# Patient Record
Sex: Male | Born: 1955 | Race: White | Hispanic: No | Marital: Married | Smoking: Never smoker
Health system: Southern US, Community
[De-identification: ages and names within clinical notes are randomized; demographics above are authoritative.]

## PROBLEM LIST (undated history)

## (undated) DIAGNOSIS — E039 Hypothyroidism, unspecified: Secondary | ICD-10-CM

## (undated) DIAGNOSIS — Z9289 Personal history of other medical treatment: Secondary | ICD-10-CM

## (undated) DIAGNOSIS — Z0389 Encounter for observation for other suspected diseases and conditions ruled out: Secondary | ICD-10-CM

## (undated) DIAGNOSIS — I10 Essential (primary) hypertension: Secondary | ICD-10-CM

## (undated) DIAGNOSIS — I480 Paroxysmal atrial fibrillation: Secondary | ICD-10-CM

## (undated) HISTORY — DX: Personal history of other medical treatment: Z92.89

## (undated) HISTORY — DX: Essential (primary) hypertension: I10

## (undated) HISTORY — DX: Hypothyroidism, unspecified: E03.9

## (undated) HISTORY — DX: Encounter for observation for other suspected diseases and conditions ruled out: Z03.89

## (undated) HISTORY — DX: Paroxysmal atrial fibrillation: I48.0

---

## 2006-05-06 DIAGNOSIS — IMO0001 Reserved for inherently not codable concepts without codable children: Secondary | ICD-10-CM

## 2006-05-06 HISTORY — DX: Reserved for inherently not codable concepts without codable children: IMO0001

## 2006-11-19 ENCOUNTER — Encounter (INDEPENDENT_AMBULATORY_CARE_PROVIDER_SITE_OTHER): Payer: Self-pay | Admitting: Cardiology

## 2006-11-19 ENCOUNTER — Inpatient Hospital Stay (HOSPITAL_COMMUNITY): Admission: EM | Admit: 2006-11-19 | Discharge: 2006-11-20 | Payer: Self-pay | Admitting: Emergency Medicine

## 2006-11-19 DIAGNOSIS — Z9289 Personal history of other medical treatment: Secondary | ICD-10-CM

## 2006-11-19 HISTORY — DX: Personal history of other medical treatment: Z92.89

## 2006-11-19 HISTORY — PX: CARDIAC CATHETERIZATION: SHX172

## 2007-08-05 IMAGING — CR DG CHEST 1V PORT
1 series · 1 of 1 positions shown · non-contrast
Comparison: None available.

CLINICAL DATA: 51-year-old with chest pain.  
 PORTABLE CHEST - 1 VIEW 11/19/06:

[view not recorded]
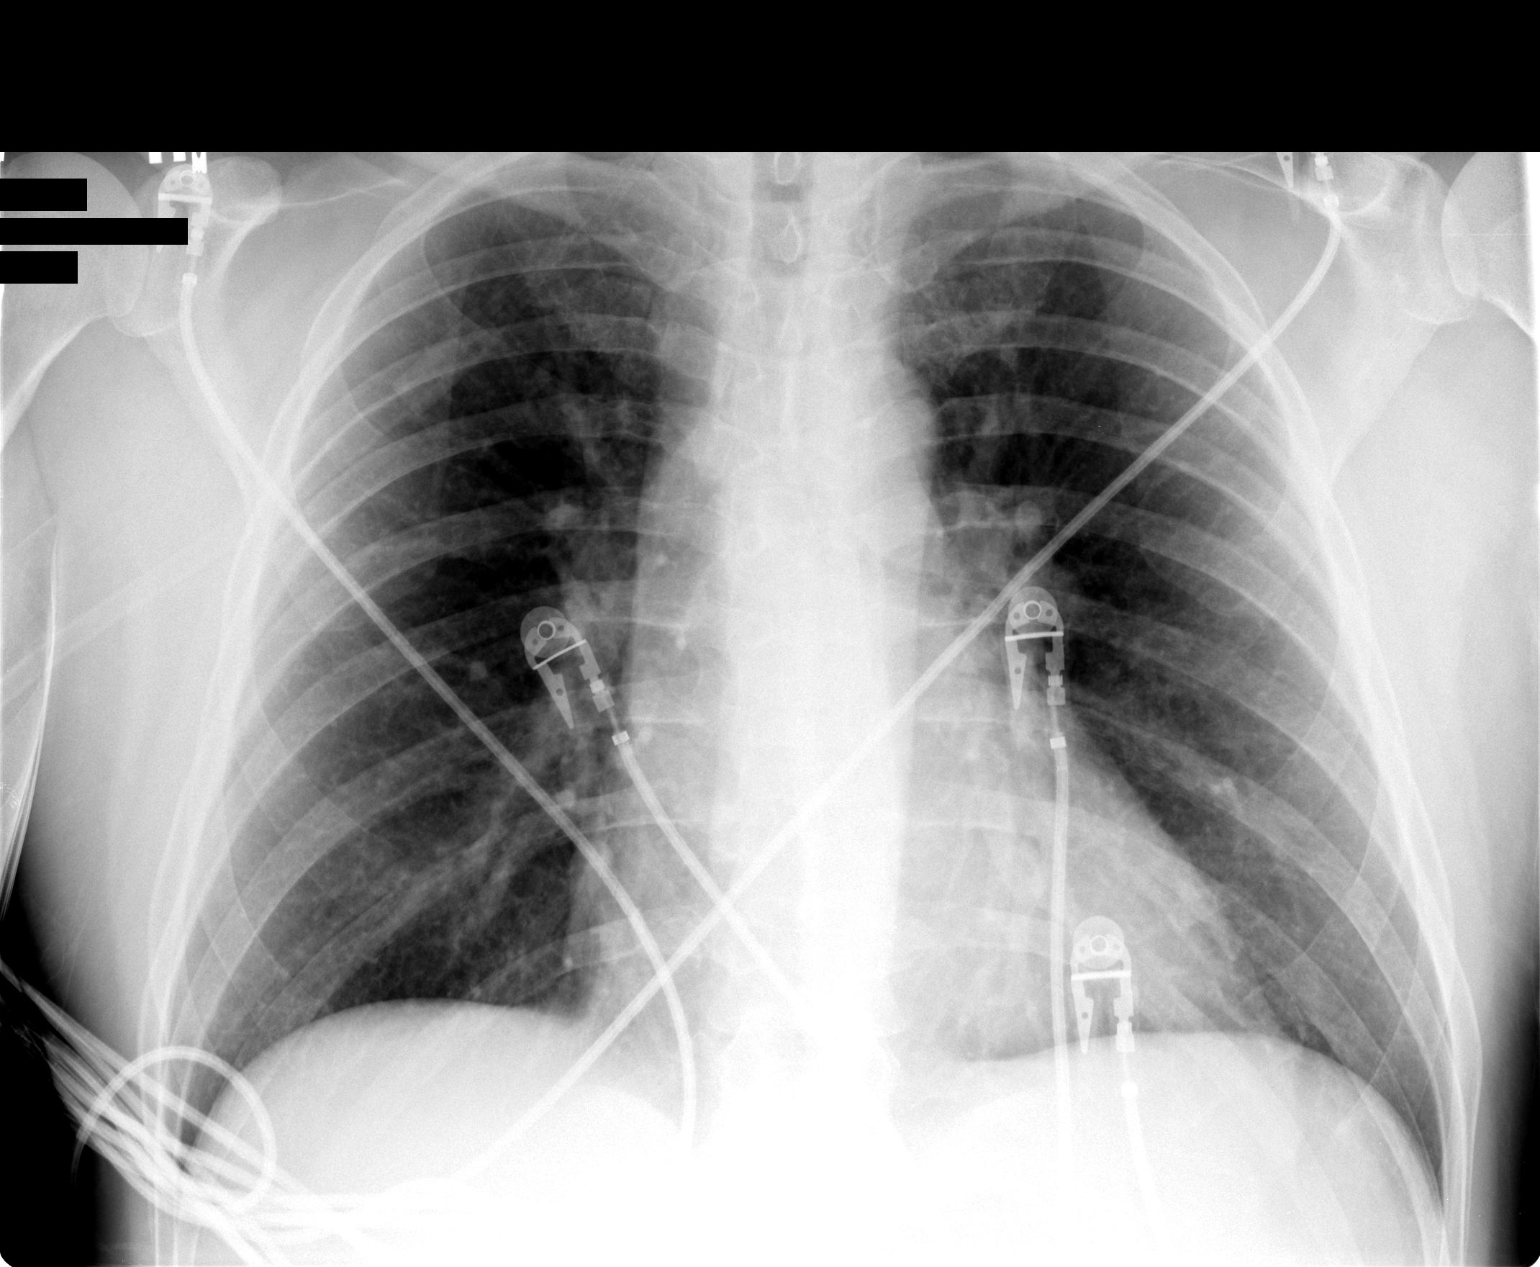

[1 of 1 positions shown; findings below may reference images not displayed]

FINDINGS: Cardiac silhouette is upper limits of normal given the AP portable projection.  Mediastinal and hilar contours are within normal limits.  Lungs are clear of acute process.  Bony structures are intact.
IMPRESSION: No acute cardiopulmonary findings.

## 2010-09-18 NOTE — H&P (Signed)
NAMEWORTHY, BOSCHERT NO.:  [id number]   MEDICAL RECORD NO.:  [id number]          PATIENT TYPE:  INP   LOCATION:  3707                         FACILITY:  [facility]   PHYSICIAN:  Ulyses Amor, MD DATE OF BIRTH:  04-27-1956   DATE OF ADMISSION:  11/19/2006  DATE OF DISCHARGE:                              HISTORY & PHYSICAL   Warren Nguyen is a 55 year old white male who is admitted to the [facility]  for the new onset of atrial fibrillation with a rapid  ventricular rate.  The patient, who has no past history of cardiac  disease, experienced the onset of palpitations and an irregular heart  beat while watching television this evening.  There was no associated  chest pain, tightness, heaviness, pressure, or squeezing.  Nor was there  any dyspnea, diaphoresis, nausea, dizziness, lightheadedness, near  syncope, or syncope.  EMS was summoned and he was found to be in atrial  fibrillation.  He was transported to the emergency department.  His  initial heart rate on presentation to the emergency department was 138  beats per minute. The patient remains in atrial fibrillation at this  time though his heart rate has been diminished with intravenous  diltiazem.   As noted, the patient has no past history of cardiac disease, including  no history of chest pain, myocardial infarction, coronary artery  disease, congestive heart failure, or arrhythmia.  He has no risk  factors for coronary artery disease, including no history of  hypertension, dyslipidemia, diabetes mellitus, smoking, or family  history of coronary artery disease.   The patient's past medical history is completely unremarkable.   MEDICATIONS:  None.   ALLERGIES:  None.   OPERATIONS:  None.   SOCIAL HISTORY:  The patient is separated.  He neither smokes cigarettes  nor drinks alcohol.   FAMILY HISTORY:  There is no family history of coronary artery disease.   REVIEW OF SYSTEMS:  No problems  related to his head, eyes, ears, nose,  mouth, throat, lungs, gastrointestinal system, genitourinary system, or  extremities.  There is no history of neurological or psychiatric  disorder.  There is no history of fever, chills, or weight loss.   PHYSICAL EXAMINATION:  VITAL SIGNS:  Blood pressure 145/80, pulse 98 and  irregularly irregular.  Respirations 20.  Temperature 98.9.  GENERAL:  The patient was a middle-aged white man in no discomfort.  He  was alert, oriented, appropriate, and responsive.  HEENT:  Normal.  NECK:  Without thyromegaly or adenopathy.  Carotid pulses were palpable  bilaterally and without bruits.  CARDIAC:  Irregularly irregular rhythm.  No gallop, murmur, rub, or  click.  CHEST:  No chest wall tenderness was noted.  LUNGS:  Clear.  ABDOMEN:  Soft, nontender.  There was no mass, hepatosplenomegaly,  bruit, distention, rebound, guarding, or rigidity.  Bowel sounds were  normal.  RECTAL/GENITAL:  Not performed as they were not pertinent to the reason  for acute care hospitalization.  EXTREMITIES:  Without edema, deviation, or deformity.  Radial and  dorsalis pedis pulses were palpable bilaterally.  NEUROLOGIC:  Brief  screening neurological survey was unremarkable.   LABORATORY DATA:  Electrocardiogram revealed atrial fibrillation.  The  tracing was otherwise normal.  The chest radiograph, according to the  radiologist, demonstrated no evidence of acute cardiopulmonary disease.  The initial set of cardiac markers revealed a myoglobin of 101.  CK-MB 3  and troponin less than 0.06.  BUN was 16, creatinine 1.1, potassium 3.5.  The remaining studies were pending at the time of this dictation.   IMPRESSION:  New onset atrial fibrillation, with rapid ventricular rate.   PLAN:  1. Telemetry.  2. Serial cardiac enzymes.  3. Aspirin.  4. Intravenous heparin.  5. Intravenous diltiazem.  6. Fasting lipid profile.  7. Thyroid function tests.  8. Echocardiograms.   9. Further measures per Dr. Elsie Lincoln.      Ulyses Amor, MD  Electronically Signed     MSC/MEDQ  D:  11/19/2006  T:  11/19/2006  Job:  [id number]   cc:   Madaline Savage, M.D.

## 2010-09-18 NOTE — Cardiovascular Report (Signed)
NAMEDONI, WIDMER                ACCOUNT NO.:  [id number]   MEDICAL RECORD NO.:  [id number]          PATIENT TYPE:  INP   LOCATION:  3707                         FACILITY:  [facility]   PHYSICIAN:  Thereasa Solo. Little, M.D. DATE OF BIRTH:  16-May-1955   DATE OF PROCEDURE:  11/19/2006  DATE OF DISCHARGE:                            CARDIAC CATHETERIZATION   INDICATIONS FOR TEST:  This 55 year old male was admitted the morning of  November 19, 2006 with new onset atrial fibrillation with rapid ventricular  response.  He had had PVCs for quite some time but had never had atrial  fibrillation.  In addition to this, he was supposed to have rotator cuff  repair later today at an outside facility.  As part of his evaluation  for his atrial fibrillation, he underwent cardiac catheterization.   After obtaining informed consent, the patient was prepped and draped in  the usual sterile fashion exposing the right groin.  Following local  anesthetic with 1% Xylocaine, the Seldinger technique was employed and a  5 [place] introducer sheath was placed into the right femoral artery.  Left and right coronary arteriography and ventriculography in the RAO  projection was performed.   EQUIPMENT:  5 French Judkins configuration catheters.   TOTAL CONTRAST:  85 mL.   COMPLICATIONS:  None.   RESULTS:  1. Hemodynamic monitoring:  Central aortic pressure of 102/61, left      ventricular pressure was 99/0 and, at the time of pullback, there      was no significant aortic valve gradient.  2. Ventriculography:  Ventriculography in the RAO projection revealed      normal LV systolic function.  The ejection fraction was in excess      of 60%.  There was no mitral regurgitation.  There was a left      ventricular end-diastolic pressure of only 7.  Ventricular ectopy      was noted during the ventriculogram, however.  3. Coronary arteriography:  Calcification in the proximal LAD was      noted on fluoroscopy.   1. Left  main normal.  2. Circumflex:  The circumflex gave rise to a large bifurcating OM #1      and OM #3.  OM #2 was a smaller vessel but the entire circumflex      system was free of disease.  3. LAD.  The LAD had only minimal irregularities.  The first diagonal      was free of disease.  As the LAD crossed the apex of the heart,      there was an area of about 40% narrowing.  This area is not      amenable to intervention nor does it need it.  4. Optional diagonal normal.  5. Right coronary artery.  This is a dominant vessel.  It was free of      disease.  There was a large PDA and a very large posterolateral      vessel.   CONCLUSION:  1. No significant coronary disease.  2. Normal LV systolic function.   The patient has converted  now into sinus rhythm.  I plan to change his  Cardizem to p.o. and I will not restart his IV heparin.  His atrial  fibrillation was less than 24 hours in duration.  A 2-D echo was pending  at the time of this dictation and, if there are no substantial  abnormalities on his echocardiogram and he remains in sinus rhythm, he  could be discharged to home tomorrow.  If, however, he has recurrent  atrial fibrillation, he will need long-term anticoagulant therapy with  Coumadin.   This would probably be an opportune time for him to have his shoulder  repaired since it does not appear that he will need to be on  anticoagulation at this point.           ______________________________  Thereasa Solo. Little, M.D.     ABL/MEDQ  D:  11/19/2006  T:  11/20/2006  Job:  [id number]   cc:   Madaline Savage, M.D.

## 2010-09-18 NOTE — Discharge Summary (Signed)
NAMECHESKY, HEYER                ACCOUNT NO.:  [id number]   MEDICAL RECORD NO.:  [id number]          PATIENT TYPE:  INP   LOCATION:  3707                         FACILITY:  [facility]   PHYSICIAN:  Thereasa Solo. Little, M.D. DATE OF BIRTH:  Aug 20, 1955   DATE OF ADMISSION:  11/19/2006  DATE OF DISCHARGE:  11/20/2006                               DISCHARGE SUMMARY   DISCHARGE DIAGNOSES:  1. Paroxysmal atrial fibrillation, converted spontaneously to sinus      rhythm.  2. Abnormal thyroid-stimulating hormone, to be followed up by his      primary care doctor.  3. Normal coronaries and normal left ventricular function by      catheterization this admission.   HOSPITAL COURSE:  The patient is a 55 year old male who presented to  [facility] ER with AF with new onset.  He has no prior history of  coronary disease.  The patient was admitted by Dr. Waldon Reining.  He was put  on IV heparin and diltiazem.  Troponins were 0.06.  Otherwise negative.  It should be noted he does have a biceps tendon injury on his left  shoulder that will require surgery at some point.  Echocardiogram was  essentially normal.  He underwent diagnostic catheterization November 19, 2006, by Dr. Clarene Duke, which revealed normal coronaries and normal LV  function.  He converted to sinus rhythm spontaneously.  Dr. Clarene Duke feels  he can be discharged November 20, 2006.  For now he will go home on baby  aspirin and diltiazem 180 mg a day.  We should note that his TSH was  elevated at 8.69.  Dr. Clarene Duke feels he can follow this up with his  family doctor for now.  He is okay for shoulder surgery whenever this is  needed.  He will see Dr. Clarene Duke back in the office in a few weeks.Marland Kitchen   DISCHARGE MEDICATIONS:  1. Diltiazem CD 180 daily.  2. Aspirin 81 mg a day.   LABS:  White count 6.4, hemoglobin 14.4, hematocrit 43.4, platelets 223.  Sodium 143, potassium 4.1, BUN 17, creatinine 1.1.  Liver functions were  normal.  CK, MB and troponins were  negative.  LDL was 111, HDL 61.  BNP  is less than 30.  TSH is 8.68.  chest x-ray:  No acute process.  INR is  1.0.   DISPOSITION:  The patient is discharged in stable condition and will  follow up with Dr. Clarene Duke in a few weeks.      Abelino Derrick, P.A.    ______________________________  Thereasa Solo. Little, M.D.    Lenard Lance  D:  11/20/2006  T:  11/21/2006  Job:  [id number]   cc:   Alba Destine, MD

## 2011-02-18 LAB — POCT CARDIAC MARKERS
CKMB, poc: 3
Myoglobin, poc: 101
Myoglobin, poc: 62.6
Operator id: 282201
Troponin i, poc: <0.05
Troponin i, poc: <0.05

## 2011-02-18 LAB — BASIC METABOLIC PANEL
CO2: 29
Calcium: 9.1
Creatinine, Ser: 1.1
GFR calc Af Amer: >60
Glucose, Bld: 100 — ABNORMAL HIGH

## 2011-02-18 LAB — DIFFERENTIAL
Eosinophils Relative: 0
Lymphocytes Relative: 9 — ABNORMAL LOW
Lymphs Abs: 0.9
Monocytes Absolute: 0.8 — ABNORMAL HIGH
Monocytes Relative: 8

## 2011-02-18 LAB — COMPREHENSIVE METABOLIC PANEL
AST: 28
Albumin: 3.8
Calcium: 9.4
Creatinine, Ser: 1
GFR calc Af Amer: >60
GFR calc non Af Amer: >60
Total Protein: 6.7

## 2011-02-18 LAB — LIPID PANEL
Cholesterol: 178
HDL: 61
Total CHOL/HDL Ratio: 2.9

## 2011-02-18 LAB — CARDIAC PANEL(CRET KIN+CKTOT+MB+TROPI): Troponin I: 0.04

## 2011-02-18 LAB — CK TOTAL AND CKMB (NOT AT ARMC)
CK, MB: 4.1 — ABNORMAL HIGH
Relative Index: 2.4
Total CK: 171

## 2011-02-18 LAB — APTT: aPTT: 28

## 2011-02-18 LAB — I-STAT 8, (EC8 V) (CONVERTED LAB)
BUN: 16
Bicarbonate: 26.5 — ABNORMAL HIGH
Chloride: 108
pCO2, Ven: 41 — ABNORMAL LOW
pH, Ven: 7.419 — ABNORMAL HIGH

## 2011-02-18 LAB — CBC
HCT: 45.7
Hemoglobin: 15.3
MCHC: 33.3
Platelets: 250
Platelets: 264
RBC: 4.83
RDW: 12.1
RDW: 12.7
WBC: 7.6
WBC: 9.1

## 2011-02-18 LAB — T3: T3, Total: 131.3

## 2011-02-18 LAB — MAGNESIUM: Magnesium: 2.2

## 2011-02-18 LAB — POCT I-STAT CREATININE: Creatinine, Ser: 1.1

## 2011-02-18 LAB — T4: T4, Total: 6.4

## 2011-02-18 LAB — TROPONIN I: Troponin I: 0.06

## 2012-10-09 ENCOUNTER — Telehealth: Payer: Self-pay | Admitting: Internal Medicine

## 2012-10-09 NOTE — Telephone Encounter (Signed)
Returned call.  Pt stated he had a little bit of an irregular heartbeat this morning and earlier this week.  Stated he gave blood this week and the day before it was out of rhythm, but it got better.  Pt wants to know if he needs to be seen or if he should monitor it through the weekend.  Denied CP or SOB.  Stated his HR will be fine and then it may skip or miss a beat.  Denied new symptom.  Stated he has had this happen before, but it was a while ago.  Also stated he wore a heart monitor for Dr. Clarene Duke a while ago.  Pt informed Dr. Rennis Golden will be notified for further instructions and he will receive a call back.  Pt verbalized understanding and agreed w/ plan.  Message forwarded to Dr. Rennis Golden.  Chart# [id number] placed on Dr. Blanchie Dessert cart.

## 2012-10-09 NOTE — Telephone Encounter (Signed)
Pt is expericing irregular heart rates-not all the time-very concerned-wants to know if he should be seen?

## 2012-10-09 NOTE — Telephone Encounter (Signed)
Returned call and informed pt per instructions by MD/PA.  Pt verbalized understanding and agreed w/ plan.  

## 2012-10-09 NOTE — Telephone Encounter (Signed)
Tell him to monitor it, and if it returns I am happy to see him back in the office.  Dr. Rennis Golden

## 2012-10-12 ENCOUNTER — Telehealth (HOSPITAL_COMMUNITY): Payer: Self-pay | Admitting: Internal Medicine

## 2012-10-12 ENCOUNTER — Ambulatory Visit (INDEPENDENT_AMBULATORY_CARE_PROVIDER_SITE_OTHER): Payer: Self-pay | Admitting: Cardiology

## 2012-10-12 ENCOUNTER — Encounter: Payer: Self-pay | Admitting: Cardiology

## 2012-10-12 VITALS — BP 140/80 | HR 57 | Ht 68.0 in | Wt 195.0 lb

## 2012-10-12 DIAGNOSIS — Z0389 Encounter for observation for other suspected diseases and conditions ruled out: Secondary | ICD-10-CM

## 2012-10-12 DIAGNOSIS — IMO0001 Reserved for inherently not codable concepts without codable children: Secondary | ICD-10-CM | POA: Insufficient documentation

## 2012-10-12 DIAGNOSIS — I4891 Unspecified atrial fibrillation: Secondary | ICD-10-CM

## 2012-10-12 DIAGNOSIS — I480 Paroxysmal atrial fibrillation: Secondary | ICD-10-CM | POA: Insufficient documentation

## 2012-10-12 DIAGNOSIS — R002 Palpitations: Secondary | ICD-10-CM

## 2012-10-12 DIAGNOSIS — I10 Essential (primary) hypertension: Secondary | ICD-10-CM | POA: Insufficient documentation

## 2012-10-12 NOTE — Assessment & Plan Note (Signed)
Single episode July 2008

## 2012-10-12 NOTE — Assessment & Plan Note (Signed)
controled

## 2012-10-12 NOTE — Telephone Encounter (Signed)
Returned call.  Pt stated his heartbeat got back in rhythm since Friday.  Stated he is just concerned and wants to talk to somebody about what might be causing this.  Appt scheduled for today ayt 3pm for evaluation.  Pt advised to bring in all meds, including otc.  Pt verbalized understanding and agreed w/ plan.

## 2012-10-12 NOTE — Patient Instructions (Addendum)
Call if you have sustained palpitations or atrial fibrilation

## 2012-10-12 NOTE — Progress Notes (Signed)
10/12/2012 [name]   09-May-1955  [id number]  Primary Physicia No primary provider on file. Primary Cardiologist: Dr Rennis Golden  HPI:  57 y/o followed by Dr Clarene Duke in the past, now followed by Dr Rennis Golden. He has a history of PAF in 2008. This was in the setting of some thyroid abnormalities. He had a cath then that showed normal coronaries. He has a low CHADS score and is on ASA. He has a tendency to be bradycardic and is on Diltiazem as opposed to a beta blocker.          He is seen now as an add on for palpitations . This past weekend he said he had palpitations- described as a hard beat followed by a pause. He denies any sustained tachycardia. His symptoms are gone today and he is in NSR in the office.   Current Outpatient Prescriptions  Medication Sig Dispense Refill  . aspirin 325 MG EC tablet Take 325 mg by mouth daily.      Marland Kitchen diltiazem (DILACOR XR) 180 MG 24 hr capsule Take 180 mg by mouth daily.      Marland Kitchen levothyroxine (SYNTHROID, LEVOTHROID) 100 MCG tablet Take 100 mcg by mouth daily before breakfast.       No current facility-administered medications for this visit.    No Known Allergies  History   Social History  . Marital Status: Married    Spouse Name: N/A    Number of Children: N/A  . Years of Education: N/A   Occupational History  . Not on file.   Social History Main Topics  . Smoking status: Never Smoker   . Smokeless tobacco: Not on file  . Alcohol Use: No  . Drug Use: Not on file  . Sexually Active: Not on file   Other Topics Concern  . Not on file   Social History Narrative  . No narrative on file     Review of Systems: General: negative for chills, fever, night sweats or weight changes.  Cardiovascular: negative for chest pain, dyspnea on exertion, edema, orthopnea, palpitations, paroxysmal nocturnal dyspnea or shortness of breath Dermatological: negative for rash Respiratory: negative for cough or wheezing Urologic: negative for hematuria Abdominal:  negative for nausea, vomiting, diarrhea, bright red blood per rectum, melena, or hematemesis Neurologic: negative for visual changes, syncope, or dizziness All other systems reviewed and are otherwise negative except as noted above.    Blood pressure 140/80, pulse 57, height 5\' 8"  (1.727 m), weight 195 lb (88.451 kg).  General appearance: alert, cooperative and no distress Lungs: clear to auscultation bilaterally Heart: regular rate and rhythm Extremities: extremities normal, atraumatic, no cyanosis or edema  EKG  EKG: normal EKG, normal sinus rhythm, unchanged from previous tracings.  ASSESSMENT AND PLAN:   PAF (paroxysmal atrial fibrillation) Single episode July 2008  Palpitations He had extra beats followed by pauses this past weekend  HTN (hypertension) controled  Normal coronary arteries July 2008   PLAN  I reassured him that I did not think his PAF had recurred. He denied any sustained arrythmia, only extra beats. I made no changes in his medications. He will need a Holter if he has more symptoms.   Georgenia Salim KPA-C 10/12/2012 5:24 PM

## 2012-10-12 NOTE — Assessment & Plan Note (Signed)
July 2008

## 2012-10-12 NOTE — Assessment & Plan Note (Signed)
He had extra beats followed by pauses this past weekend

## 2012-10-12 NOTE — Telephone Encounter (Signed)
Pt would like to get a call back about his irregular heart beat. Pt called on Friday and spoke to someone and they told him to monitor it over the weekend and he thinks that it went back to normal but would like to talk to someone about it.

## 2012-10-24 ENCOUNTER — Encounter: Payer: Self-pay | Admitting: Internal Medicine

## 2012-11-26 ENCOUNTER — Ambulatory Visit (INDEPENDENT_AMBULATORY_CARE_PROVIDER_SITE_OTHER): Payer: BC Managed Care – HMO | Admitting: Cardiology

## 2012-11-26 ENCOUNTER — Telehealth: Payer: Self-pay | Admitting: Internal Medicine

## 2012-11-26 ENCOUNTER — Encounter: Payer: Self-pay | Admitting: Cardiology

## 2012-11-26 VITALS — BP 140/80 | HR 58 | Ht 68.0 in | Wt 190.8 lb

## 2012-11-26 DIAGNOSIS — R002 Palpitations: Secondary | ICD-10-CM

## 2012-11-26 DIAGNOSIS — I4891 Unspecified atrial fibrillation: Secondary | ICD-10-CM

## 2012-11-26 DIAGNOSIS — I10 Essential (primary) hypertension: Secondary | ICD-10-CM

## 2012-11-26 DIAGNOSIS — I480 Paroxysmal atrial fibrillation: Secondary | ICD-10-CM

## 2012-11-26 MED ORDER — DILTIAZEM HCL 30 MG PO TABS: 30.0000 mg | ORAL_TABLET | ORAL | Status: DC | PRN

## 2012-11-26 NOTE — Telephone Encounter (Signed)
Heart out of rhythm for about six hours (12:30 am to 6:30 am).  Back in rhythm now.  Took Diltiazim 60 mg.  Does he need to be seen?  Feels fine now.

## 2012-11-26 NOTE — Telephone Encounter (Signed)
Dr. Rennis Golden notified and reviewed last OV note.  Advised pt come in w/ [facility] for evaluation and possible monitor.  Returned call.  Pt stated he woke up this morning to a fast HR and thinks it may have been Afib.  Stated he took diltiazem 60 mg ~ 1:30am.  Stated about 6:30am he went back to a normal rhythm and took his regular dose of diltiazem.  Pt wanted to know if he should be seen or not.  Pt informed per Dr. Rennis Golden and agreed w/ plan.  Appt scheduled for today at 3pm w/ L. Kilroy, PA-C.

## 2012-11-26 NOTE — Patient Instructions (Addendum)
Your physician has recommended that you wear a holter monitor. Holter monitors are medical devices that record the heart's electrical activity. Doctors most often use these monitors to diagnose arrhythmias. Arrhythmias are problems with the speed or rhythm of the heartbeat. The monitor is a small, portable device. You can wear one while you do your normal daily activities. This is usually used to diagnose what is causing palpitations/syncope (passing out).  Your physician recommends that you schedule a follow-up appointment in: one month with Dr Rennis Golden

## 2012-11-26 NOTE — Progress Notes (Signed)
11/26/2012 Warren Nguyen   26-Aug-1955  [id number]  Primary Physicia No primary provider on file. Primary Cardiologist: Dr Rennis Golden  HPI:  57 y/o previously followed by Dr Clarene Duke with a history of PAF. He has a low CHADS score and is on ASA. I saw him in June after and episode and he is here today after another episode this weekend. The pt says he noted an irreg HR for about 6 hrs. He took an extra Diltiazem 60mg  and it broke spontaneously the next morning. He says "I Almost went to the ER".    Current Outpatient Prescriptions  Medication Sig Dispense Refill  . diltiazem (CARDIZEM) 30 MG tablet Take 30 mg by mouth as needed.      Marland Kitchen aspirin 325 MG EC tablet Take 325 mg by mouth daily.      Marland Kitchen diltiazem (DILACOR XR) 180 MG 24 hr capsule Take 180 mg by mouth daily.      Marland Kitchen levothyroxine (SYNTHROID, LEVOTHROID) 100 MCG tablet Take 100 mcg by mouth daily before breakfast.       No current facility-administered medications for this visit.    No Known Allergies  History   Social History  . Marital Status: Married    Spouse Name: N/A    Number of Children: N/A  . Years of Education: N/A   Occupational History  . Not on file.   Social History Main Topics  . Smoking status: Never Smoker   . Smokeless tobacco: Not on file  . Alcohol Use: No  . Drug Use: Not on file  . Sexually Active: Not on file   Other Topics Concern  . Not on file   Social History Narrative  . No narrative on file     Review of Systems: General: negative for chills, fever, night sweats or weight changes.  Cardiovascular: negative for chest pain, dyspnea on exertion, edema, orthopnea, palpitations, paroxysmal nocturnal dyspnea or shortness of breath Dermatological: negative for rash Respiratory: negative for cough or wheezing Urologic: negative for hematuria Abdominal: negative for nausea, vomiting, diarrhea, bright red blood per rectum, melena, or hematemesis Neurologic: negative for visual changes, syncope, or  dizziness All other systems reviewed and are otherwise negative except as noted above.    Blood pressure 140/80, pulse 58, height 5\' 8"  (1.727 m), weight 190 lb 12.8 oz (86.546 kg).  General appearance: alert, cooperative and no distress Neck: no carotid bruit, no JVD and thyroid not enlarged, symmetric, no tenderness/mass/nodules Lungs: clear to auscultation bilaterally Heart: regular rate and rhythm  EKG  EKG: normal EKG, normal sinus rhythm, unchanged from previous tracings, sinus bradycardia.  ASSESSMENT AND PLAN:   PAF (paroxysmal atrial fibrillation) Recurrent episode this past weekend, lasted 6 hrs  HTN (hypertension) Controlled   PLAN  I suggested we proceed with a 30 day monitor to try and document what exactly he is having and how often. He also is bradycardic at rest with a HR in the 50s so we are limited in what medication we can use. He was reluctant but has agreed.  It's a little unclear how symptomatic he is but he defiantly knows when he has extra beats and irreg HR. He seems to understand that we don't think this is dangerous but he almost went to the ER and comes to the office whenever he has one of these episodes.   Tashona Nguyen KPA-C 11/26/2012 4:03 PM

## 2012-11-26 NOTE — Assessment & Plan Note (Signed)
Controlled.  

## 2012-11-26 NOTE — Assessment & Plan Note (Signed)
Recurrent episode this past weekend, lasted 6 hrs

## 2012-12-28 ENCOUNTER — Ambulatory Visit (INDEPENDENT_AMBULATORY_CARE_PROVIDER_SITE_OTHER): Payer: BC Managed Care – HMO | Admitting: Internal Medicine

## 2012-12-28 ENCOUNTER — Encounter: Payer: Self-pay | Admitting: Internal Medicine

## 2012-12-28 VITALS — BP 140/76 | HR 60 | Ht 68.0 in | Wt 195.9 lb

## 2012-12-28 DIAGNOSIS — I4891 Unspecified atrial fibrillation: Secondary | ICD-10-CM

## 2012-12-28 DIAGNOSIS — R002 Palpitations: Secondary | ICD-10-CM

## 2012-12-28 DIAGNOSIS — I480 Paroxysmal atrial fibrillation: Secondary | ICD-10-CM

## 2012-12-28 NOTE — Progress Notes (Signed)
12/28/2012 Warren Nguyen   February 22, 1956  [id number]  Primary Physicia Pcp Not In System Primary Cardiologist: Dr Warren Nguyen  HPI:  57 y/o previously followed by Dr Warren Nguyen with a history of PAF. He has a low CHADS score and is on ASA. I saw him in June after and episode and he is here today after another episode this weekend. The pt says he noted an irreg HR for about 6 hrs. He took an extra Diltiazem 60mg  and it broke spontaneously the next morning. He says "I Almost went to the ER".   Warren Nguyen returned today for followup of his one-month monitor. There were no episodes of atrial fibrillation or palpitations that were noted. He reported not being symptomatic during that time.   Current Outpatient Prescriptions  Medication Sig Dispense Refill  . aspirin 325 MG EC tablet Take 325 mg by mouth daily.      Marland Kitchen diltiazem (CARDIZEM) 30 MG tablet Take 1 tablet (30 mg total) by mouth as needed.  30 tablet  1  . diltiazem (DILACOR XR) 180 MG 24 hr capsule Take 180 mg by mouth daily.      Marland Kitchen levothyroxine (SYNTHROID, LEVOTHROID) 100 MCG tablet Take 100 mcg by mouth daily before breakfast.       No current facility-administered medications for this visit.    No Known Allergies  History   Social History  . Marital Status: Married    Spouse Name: N/A    Number of Children: N/A  . Years of Education: N/A   Occupational History  . Not on file.   Social History Main Topics  . Smoking status: Never Smoker   . Smokeless tobacco: Not on file  . Alcohol Use: No  . Drug Use: Not on file  . Sexual Activity: Not on file   Other Topics Concern  . Not on file   Social History Narrative  . No narrative on file     Review of Systems: General: negative for chills, fever, night sweats or weight changes.  Cardiovascular: negative for chest pain, dyspnea on exertion, edema, orthopnea, palpitations, paroxysmal nocturnal dyspnea or shortness of breath Dermatological: negative for rash Respiratory: negative  for cough or wheezing Urologic: negative for hematuria Abdominal: negative for nausea, vomiting, diarrhea, bright red blood per rectum, melena, or hematemesis Neurologic: negative for visual changes, syncope, or dizziness All other systems reviewed and are otherwise negative except as noted above.    Blood pressure 140/76, pulse 60, height 5\' 8"  (1.727 m), weight 195 lb 14.4 oz (88.86 kg).  Deferred exam today  EKG   deferred  ASSESSMENT AND PLAN:   Patient Active Problem List   Diagnosis Date Noted  . PAF (paroxysmal atrial fibrillation) 10/12/2012  . Palpitations 10/12/2012  . HTN (hypertension) 10/12/2012  . Normal coronary arteries 10/12/2012   PLAN   Warren Nguyen had no episodes of atrial fibrillation or abnormal palpitations during his 30 day monitor. I recommend continuing current medications and if he has further breakthroughs that are happening more frequently, he may need antiarrhythmic therapy.  Chrystie Nose, MD, [facility] Attending Cardiologist [facility]  Nguyen,Warren C 12/28/2012 4:16 PM

## 2012-12-28 NOTE — Patient Instructions (Addendum)
Your physician wants you to follow-up in: 1 year. You will receive a reminder letter in the mail two months in advance. If you don't receive a letter, please call our office to schedule the follow-up appointment.  

## 2013-08-19 ENCOUNTER — Telehealth: Payer: Self-pay | Admitting: Internal Medicine

## 2013-08-19 NOTE — Telephone Encounter (Signed)
Monitor, Diltiazem prn

## 2013-08-19 NOTE — Telephone Encounter (Signed)
Please call-Pt have been having irregular heart beats.He needs your advice.

## 2013-08-19 NOTE — Telephone Encounter (Signed)
Returned call to patient. Stated since Monday he has noticed an irregular heart beat, described as NOT being fast, but as extra beats/pauses. Reports was worse Tuesday and Wednesday, but is better today, only noticing a little in the AM. Reports that the symptoms are more noticeable about mealtimes/eating. Patient denies CP/SOB and reports he has been taking his medications as prescribed and has NOT used the diltiazem 30mg  as needed, as he does not feel like this an episode of AF, as he previously had in the summer 2014. [facility], PA-C to see if there is anything to be done, or just monitor.

## 2013-08-19 NOTE — Telephone Encounter (Signed)
Called patient with advice per Warren Nguyen - symptoms persist, worsen, become symptomatic. Verbalized understanding. Patient also states his recent TSH was normal.

## 2013-08-26 ENCOUNTER — Ambulatory Visit (INDEPENDENT_AMBULATORY_CARE_PROVIDER_SITE_OTHER): Payer: BC Managed Care – PPO | Admitting: Cardiology

## 2013-08-26 ENCOUNTER — Encounter: Payer: Self-pay | Admitting: Cardiology

## 2013-08-26 VITALS — BP 148/82 | HR 62 | Ht 68.0 in | Wt 196.9 lb

## 2013-08-26 DIAGNOSIS — E039 Hypothyroidism, unspecified: Secondary | ICD-10-CM | POA: Insufficient documentation

## 2013-08-26 DIAGNOSIS — R002 Palpitations: Secondary | ICD-10-CM

## 2013-08-26 DIAGNOSIS — I480 Paroxysmal atrial fibrillation: Secondary | ICD-10-CM

## 2013-08-26 DIAGNOSIS — I4891 Unspecified atrial fibrillation: Secondary | ICD-10-CM

## 2013-08-26 DIAGNOSIS — Z8249 Family history of ischemic heart disease and other diseases of the circulatory system: Secondary | ICD-10-CM

## 2013-08-26 DIAGNOSIS — I10 Essential (primary) hypertension: Secondary | ICD-10-CM

## 2013-08-26 LAB — BASIC METABOLIC PANEL
BUN: 23 mg/dL (ref 6–23)
CALCIUM: 9.3 mg/dL (ref 8.4–10.5)
CHLORIDE: 102 meq/L (ref 96–112)
CO2: 28 meq/L (ref 19–32)
CREATININE: 1 mg/dL (ref 0.50–1.35)
Glucose, Bld: 91 mg/dL (ref 70–99)
Potassium: 4.3 mEq/L (ref 3.5–5.3)
SODIUM: 138 meq/L (ref 135–145)

## 2013-08-26 LAB — MAGNESIUM: Magnesium: 2.1 mg/dL (ref 1.5–2.5)

## 2013-08-26 NOTE — Assessment & Plan Note (Signed)
Hx of PAF   Chads 2Vasc2 score 0

## 2013-08-26 NOTE — Assessment & Plan Note (Signed)
BP up slightly with increase if dilt it may control BP better.

## 2013-08-26 NOTE — Progress Notes (Signed)
08/26/2013   PCP: Pcp Not In System   Chief Complaint  Patient presents with  . Irregular Heart Beat    Off and on for ~1 week, otherwise, no complaints.    Primary Cardiologist: Dr. Ellene RouteK. Hilty   HPI:  58 y/o previously followed by Dr Clarene DukeLittle with a history of PAF. He has a low CHADS score and is on ASA.  He had a cardiac catheterization in 2008 revealing normal coronary arteries and normal EF.  He's had episodes where he felt he may be having atrophia and when the monitor but we were unable to capture any atrial fib.  He presents today secondary to palpitations these occur daily usually in the morning or after lunch. He denies chest pain or shortness of breath with them but they're worrisome and he has not felt well he has. He also relates that both his father and grandfather died with heart attacks in their 6960s and is concerned as these episodes of palpitations usually occur after exertion, that they may be related to coronary artery disease at this point.  He does have hyperthyroidism and recently evaluated his TSH was 1.84.  At times has been bradycardic which has made it difficult to increase his Cardizem.  He does use 30 mg by mouth Cardizem when necessary when he does have episodes of atrial fibrillation.   No Known Allergies  Current Outpatient Prescriptions  Medication Sig Dispense Refill  . aspirin 325 MG EC tablet Take 325 mg by mouth daily.      Marland Kitchen. diltiazem (CARDIZEM) 30 MG tablet Take 1 tablet (30 mg total) by mouth as needed.  30 tablet  1  . diltiazem (DILACOR XR) 180 MG 24 hr capsule Take 180 mg by mouth daily.      Marland Kitchen. levothyroxine (SYNTHROID, LEVOTHROID) 100 MCG tablet Take 100 mcg by mouth daily before breakfast.       No current facility-administered medications for this visit.    Past Medical History  Diagnosis Date  . PAF (paroxysmal atrial fibrillation)     originally diagnosed in 2008 when his thyroid was abnormal, monitor 02/2007- no afib,  frequent pacs; CHADS of 1- not on an anticoagulant   . Hypothyroidism     on levothyroxine  . HTN (hypertension)   . History of echocardiogram 11/19/2006    LV normal in size, EF 55-60%, mild valvular disease  . Normal coronary arteries 2008     40% LAD    Past Surgical History  Procedure Laterality Date  . Cardiac catheterization  11/19/06    no CAD, nl EF    QVZ:DGLOVFI:EPROS:General:no colds or fevers, no weight changes Skin:no rashes or ulcers HEENT:no blurred vision, no congestion CV:see HPI PUL:see HPI GI:no diarrhea constipation or melena, no indigestion GU:no hematuria, no dysuria MS:no joint pain, no claudication Neuro:no syncope, no lightheadedness Endo:no diabetes, + thyroid disease recent TSH by PCP 1.84 and Free T4 0.91   PHYSICAL EXAM BP 148/82  Pulse 62  Ht 5\' 8"  (1.727 m)  Wt 196 lb 14.4 oz (89.313 kg)  BMI 29.95 kg/m2 General:Pleasant affect, NAD Skin:Warm and dry, brisk capillary refill HEENT:normocephalic, sclera clear, mucus membranes moist Neck:supple, no JVD, no bruits  Heart:S1S2 RRR without murmur, gallup, rub or click Lungs:clear without rales, rhonchi, or wheezes PIR:JJOAAbd:soft, non tender, + BS, do not palpate liver spleen or masses Ext:no lower ext edema, 2+ pedal pulses, 2+ radial pulses Neuro:alert and oriented, MAE, follows commands, + facial  symmetry EKG: SR with PACs, no acute changes otherwise.   ASSESSMENT AND PLAN Palpitations PACs on EKG, more freq episodes of these.  He is concerned because they occur with activity more than at rest and with his family history of father and grandfather dying of MIs he is worried that this may be a sign of CAD.  He has bradycardia at times so meds are limited.  He is on cardizem CD 180 mg I have asked him to daily take 30 mg po dilt after lunch to see if this will control arrythmia.  He agrees to this plan. He has worn event monitors in the past without recurrent PAF.  Will treat PACs for now.    PAF (paroxysmal atrial  fibrillation) Hx of PAF   Chads 2Vasc2 score 0  FH: CAD (coronary artery disease) Both father and grandfather died in their 7360s with MIs, pt had normal cardiac cath in 2008, with recurrent PACs I have ordered an exercise myoview, to eval PACs with exercise and assist with ischemic eval.     HTN (hypertension) BP up slightly with increase if dilt it may control BP better.   Hypothyroid, followed by PCP Stable on recent check.   We'll check a basic metabolic panel and a magnesium level today I do not see those in the computer from his primary care.

## 2013-08-26 NOTE — Assessment & Plan Note (Signed)
Stable on recent check.  

## 2013-08-26 NOTE — Assessment & Plan Note (Addendum)
PACs on EKG, more freq episodes of these.  He is concerned because they occur with activity more than at rest and with his family history of father and grandfather dying of MIs he is worried that this may be a sign of CAD.  He has bradycardia at times so meds are limited.  He is on cardizem CD 180 mg I have asked him to daily take 30 mg po dilt after lunch to see if this will control arrythmia.  He agrees to this plan. He has worn event monitors in the past without recurrent PAF.  Will treat PACs for now.

## 2013-08-26 NOTE — Assessment & Plan Note (Signed)
Both father and grandfather died in their 4660s with MIs, pt had normal cardiac cath in 2008, with recurrent PACs I have ordered an exercise myoview, to eval PACs with exercise and assist with ischemic eval.

## 2013-08-26 NOTE — Patient Instructions (Signed)
Will do stress myoview to check for coronary artery disease.  Take an extra 30 mg cardizem every day around noon (since your symptoms are earlier in the day) to see if it will prevent the extra beats.  If you begin feeling weak or tired let us know.  Follow up with Dr. Rennis GoldenHilty  For results, though we will call you as well.  Call if worsening symptoms.

## 2013-09-01 ENCOUNTER — Telehealth (HOSPITAL_COMMUNITY): Payer: Self-pay

## 2013-09-03 ENCOUNTER — Telehealth: Payer: Self-pay | Admitting: *Deleted

## 2013-09-03 ENCOUNTER — Ambulatory Visit (HOSPITAL_COMMUNITY)
Admission: RE | Admit: 2013-09-03 | Discharge: 2013-09-03 | Disposition: A | Discharge status: Home / Self Care | Payer: BC Managed Care – PPO | Source: Ambulatory Visit | Attending: Cardiovascular Disease | Admitting: Cardiovascular Disease

## 2013-09-03 DIAGNOSIS — R002 Palpitations: Secondary | ICD-10-CM | POA: Diagnosis present

## 2013-09-03 DIAGNOSIS — Z8249 Family history of ischemic heart disease and other diseases of the circulatory system: Secondary | ICD-10-CM | POA: Diagnosis not present

## 2013-09-03 DIAGNOSIS — I10 Essential (primary) hypertension: Secondary | ICD-10-CM | POA: Diagnosis not present

## 2013-09-03 DIAGNOSIS — I480 Paroxysmal atrial fibrillation: Secondary | ICD-10-CM

## 2013-09-03 DIAGNOSIS — I4891 Unspecified atrial fibrillation: Secondary | ICD-10-CM | POA: Insufficient documentation

## 2013-09-03 DIAGNOSIS — I2510 Atherosclerotic heart disease of native coronary artery without angina pectoris: Secondary | ICD-10-CM | POA: Insufficient documentation

## 2013-09-03 MED ORDER — TECHNETIUM TC 99M SESTAMIBI GENERIC - CARDIOLITE
10.8000 | Freq: Once | INTRAVENOUS | Status: AC | PRN
Start: 2013-09-03 — End: 2013-09-03
  Administered 2013-09-03: 11 via INTRAVENOUS

## 2013-09-03 MED ORDER — TECHNETIUM TC 99M SESTAMIBI GENERIC - CARDIOLITE
30.9000 | Freq: Once | INTRAVENOUS | Status: AC | PRN
  Administered 2013-09-03: 30.9 via INTRAVENOUS

## 2013-09-03 NOTE — Procedures (Addendum)
[facility] [address] [place] [address] [phone]  Cardiology Nuclear Med Study  Warren GrillRobert Nguyen is a 58 y.o. male     MRN : [id number]     DOB: 1956-04-19  Procedure Date: 09/03/2013  Nuclear Med Background Indication for Stress Test:  Evaluation for Ischemia and Abnormal EKG History:  CAD;PAF;No prior NUC MPI for comparison;ECHO on 11/19/2006-EF=55-60% Cardiac Risk Factors: Family History - CAD, Hypertension and Overweight  Symptoms:  Palpitations   Nuclear Pre-Procedure Caffeine/Decaff Intake:  7:00pm NPO After: 5:00am   IV Site: R Hand  IV 0.9% NS with Angio Cath:  22g  Chest Size (in):  42"  IV Started by: Berdie OgrenAmanda Wease, RN  Height: 5\' 8"  (1.727 m)  Cup Size: n/a  BMI:  Body mass index is 29.81 kg/(m^2). Weight:  196 lb (88.905 kg)   Tech Comments:  n/a    Nuclear Med Study 1 or 2 day study: 1 day  Stress Test Type:  Stress  Order Authorizing Provider:  Zoila ShutterKenneth Hilty, MD   Resting Radionuclide: Technetium 2369m Sestamibi  Resting Radionuclide Dose: 10.8 mCi   Stress Radionuclide:  Technetium 3869m Sestamibi  Stress Radionuclide Dose: 30.9 mCi           Stress Protocol Rest HR: 67 Stress HR: 153  Rest BP: 142/83 Stress BP: 183/73  Exercise Time (min): 11 METS: 13.4   Predicted Max HR: 161 bpm % Max HR: 95.03 bpm Rate Pressure Product: [id number]  Dose of Adenosine (mg):  n/a Dose of Lexiscan: n/a mg  Dose of Atropine (mg): n/a Dose of Dobutamine: n/a mcg/kg/min (at max HR)  Stress Test Technologist: Esperanza Sheetserry-Marie Martin, CCT Nuclear Technologist: Gonzella LexPam Phillips, CNMT   Rest Procedure:  Myocardial perfusion imaging was performed at rest 45 minutes following the intravenous administration of Technetium 1569m Sestamibi. Stress Procedure:  The patient performed treadmill exercise using a Bruce  Protocol for 11 minutes. The patient stopped due to SOB and denied any chest pain.  There were no significant ST-T wave changes.   Technetium 6669m Sestamibi was injected at IV peak exercise and myocardial perfusion imaging was performed after a brief delay.  Transient Ischemic Dilatation (Normal <1.22):  0.80 Lung/Heart Ratio (Normal <0.45):  0.22 QGS EDV:  85 ml QGS ESV:  27 ml LV Ejection Fraction: 68%       Rest ECG: NSR - Normal EKG  Stress ECG: No significant change from baseline ECG  QPS Raw Data Images:  Normal; no motion artifact; normal heart/lung ratio. Stress Images:  Normal homogeneous uptake in all areas of the myocardium. Rest Images:  Normal homogeneous uptake in all areas of the myocardium. Subtraction (SDS):  No evidence of ischemia.  Impression Exercise Capacity:  Excellent exercise capacity. BP Response:  Normal blood pressure response. Clinical Symptoms:  No significant symptoms noted. ECG Impression:  No significant ST segment change suggestive of ischemia. Comparison with Prior Nuclear Study: No images to compare  Overall Impression:  Normal stress nuclear study.  LV Wall Motion:  NL LV Function; NL Wall Motion   [name], MD  09/03/2013 12:37 PM

## 2013-09-03 NOTE — Telephone Encounter (Signed)
Walk-In Message: "Do I continue to take the 30 mg Cardizem witht he 180 mg now that I am in rhythm since Monday?"  Returned call.  No answer/voicemail.  Will try later.     If pt's symptoms have improved and BP lower, then he should continue current regimen and call back if he starts feeling weak as instructed.

## 2013-09-03 NOTE — Telephone Encounter (Signed)
Returned call.  No answer/voicemail.  Will await call back from pt. 

## 2013-10-07 ENCOUNTER — Encounter: Payer: Self-pay | Admitting: Internal Medicine

## 2013-10-07 ENCOUNTER — Ambulatory Visit (INDEPENDENT_AMBULATORY_CARE_PROVIDER_SITE_OTHER): Payer: BC Managed Care – PPO | Admitting: Internal Medicine

## 2013-10-07 VITALS — BP 149/88 | HR 65 | Ht 68.0 in | Wt 196.7 lb

## 2013-10-07 DIAGNOSIS — I480 Paroxysmal atrial fibrillation: Secondary | ICD-10-CM

## 2013-10-07 DIAGNOSIS — I4891 Unspecified atrial fibrillation: Secondary | ICD-10-CM

## 2013-10-07 DIAGNOSIS — I10 Essential (primary) hypertension: Secondary | ICD-10-CM

## 2013-10-07 DIAGNOSIS — E039 Hypothyroidism, unspecified: Secondary | ICD-10-CM

## 2013-10-07 NOTE — Patient Instructions (Signed)
Your physician wants you to follow-up in:  6 months. You will receive a reminder letter in the mail two months in advance. If you don't receive a letter, please call our office to schedule the follow-up appointment.   

## 2013-10-11 ENCOUNTER — Encounter: Payer: Self-pay | Admitting: Internal Medicine

## 2013-10-11 NOTE — Progress Notes (Signed)
10/11/2013 [name]   1955/09/02  [id number]  Primary Physicia Pcp Not In System Primary Cardiologist: Dr Rennis Golden  HPI:  58 y/o previously followed by Dr Clarene Duke with a history of PAF. He has a low CHADS score and is on ASA. I saw him in June after and episode and he is here today after another episode this weekend. The pt says he noted an irreg HR for about 6 hrs. He took an extra Diltiazem 60mg  and it broke spontaneously the next morning. He says "I Almost went to the ER".   Mr. Tortorella had a one-month monitor. There were no episodes of atrial fibrillation or palpitations that were noted. He reported not being symptomatic during that time.  Recently he had more symptoms and was seen by Nada Boozer, nurse practitioner, who recommended a stress test as there was some associated chest discomfort. In addition, it was thought that if he needed to undergo antiarrhythmic therapy, we need to exclude coronary artery disease. In fact, his stress test was negative. He said that he did have another episode of A. fib for about a half a day yesterday. He reported his dog was sick and had been up several times at night. He did take an extra short-acting diltiazem which seemed to kick in after about an hour.  Current Outpatient Prescriptions  Medication Sig Dispense Refill  . aspirin 325 MG EC tablet Take 325 mg by mouth daily.      Marland Kitchen diltiazem (CARDIZEM) 30 MG tablet Take 1 tablet (30 mg total) by mouth as needed.  30 tablet  1  . diltiazem (DILACOR XR) 180 MG 24 hr capsule Take 180 mg by mouth daily.      Marland Kitchen levothyroxine (SYNTHROID, LEVOTHROID) 100 MCG tablet Take 100 mcg by mouth daily before breakfast.       No current facility-administered medications for this visit.    No Known Allergies  History   Social History  . Marital Status: Married    Spouse Name: N/A    Number of Children: N/A  . Years of Education: N/A   Occupational History  . Not on file.   Social History Main Topics  . Smoking  status: Never Smoker   . Smokeless tobacco: Not on file  . Alcohol Use: No  . Drug Use: Not on file  . Sexual Activity: Not on file   Other Topics Concern  . Not on file   Social History Narrative  . No narrative on file     Review of Systems: General: negative for chills, fever, night sweats or weight changes.  Cardiovascular: negative for chest pain, dyspnea on exertion, edema, orthopnea, palpitations, paroxysmal nocturnal dyspnea or shortness of breath Dermatological: negative for rash Respiratory: negative for cough or wheezing Urologic: negative for hematuria Abdominal: negative for nausea, vomiting, diarrhea, bright red blood per rectum, melena, or hematemesis Neurologic: negative for visual changes, syncope, or dizziness All other systems reviewed and are otherwise negative except as noted above.   Blood pressure 149/88, pulse 65, height 5\' 8"  (1.727 m), weight 196 lb 11.2 oz (89.223 kg).  Deferred exam today  EKG   deferred  ASSESSMENT AND PLAN:   Patient Active Problem List   Diagnosis Date Noted  . FH: CAD (coronary artery disease) 08/26/2013  . Hypothyroid, followed by PCP 08/26/2013  . PAF (paroxysmal atrial fibrillation) 10/12/2012  . Palpitations 10/12/2012  . HTN (hypertension) 10/12/2012  . Normal coronary arteries 10/12/2012   PLAN   Mr. Germer reports episodes  of PAF however we have not been able to capture them on a monitor. These episodes are infrequent and short-lived. He is currently only on aspirin but has a low CHADSVASC score 1 (for HTN). His recent stress test was negative for ischemia. We discussed several management options including adding low-dose beta blocker, switching to antiarrhythmic medication, a pocket pill strategy, and even a-fib ablation. He was quite nervous about options including antiarrhythmic therapy and wishes to pursue his current medications. I also offered to refer him to cardiac electrophysiology for another opinion. At this  time he will contact me if he has another episode of PAF and I could prescribe a pocket pill for him to take.  Followup will be in 6 months.  Chrystie NoseKenneth C. Shenouda Genova, MD, [facility] Attending Cardiologist [facility]  Chrystie NoseKenneth C. Mana Morison 10/11/2013 2:01 PM

## 2013-11-03 NOTE — Telephone Encounter (Signed)
Encounter complete. 

## 2013-11-23 ENCOUNTER — Telehealth: Payer: Self-pay | Admitting: Internal Medicine

## 2013-11-23 NOTE — Telephone Encounter (Signed)
Pt says his heart been out of rythm since Sunday. Does he want him to take something for this?,he had said something about calling something in when this happen.

## 2013-11-23 NOTE — Telephone Encounter (Signed)
Spoke with pt, he feels he has been out of rhythm since Sunday. There is an occ extra beat or pause. He reports his heart rate is normal but he really has not checked it. They have settled down today and he hesitated calling. He wants to know what to do at this point. He and dr Rennis Goldenhilty had discussed a pill ion the pocket. He has no symptoms otherwise. Will forward for dr hilty review

## 2013-11-24 ENCOUNTER — Encounter: Payer: Self-pay | Admitting: Internal Medicine

## 2013-11-24 ENCOUNTER — Ambulatory Visit (INDEPENDENT_AMBULATORY_CARE_PROVIDER_SITE_OTHER): Payer: BC Managed Care – PPO | Admitting: Internal Medicine

## 2013-11-24 VITALS — BP 144/74 | HR 66 | Ht 68.0 in

## 2013-11-24 DIAGNOSIS — R002 Palpitations: Secondary | ICD-10-CM

## 2013-11-24 DIAGNOSIS — I4891 Unspecified atrial fibrillation: Secondary | ICD-10-CM

## 2013-11-24 DIAGNOSIS — I480 Paroxysmal atrial fibrillation: Secondary | ICD-10-CM

## 2013-11-24 MED ORDER — METOPROLOL TARTRATE 25 MG PO TABS: 25.0000 mg | ORAL_TABLET | Freq: Every day | ORAL | Status: DC

## 2013-11-24 NOTE — Progress Notes (Signed)
11/24/2013                Warren Nguyen   1956/03/14  [id number]  Primary Physicia Pcp Not In System Primary Cardiologist: Dr Rennis GoldenHilty  HPI:  58 y/o previously followed by Dr Clarene DukeLittle with a history of PAF. He has a low CHADS score and is on ASA. I saw him in June after and episode and he is here today after another episode this weekend. The pt says he noted an irreg HR for about 6 hrs. He took an extra Diltiazem 60mg  and it broke spontaneously the next morning. He says "I Almost went to the ER".   Warren Nguyen had a one-month monitor. There were no episodes of atrial fibrillation or palpitations that were noted. He reported not being symptomatic during that time.  Recently he had more symptoms and was seen by Nada BoozerLaura Ingold, nurse practitioner, who recommended a stress test as there was some associated chest discomfort. In addition, it was thought that if he needed to undergo antiarrhythmic therapy, we need to exclude coronary artery disease. In fact, his stress test was negative. He said that he did have another episode of A. fib for about a half a day yesterday. He reported his dog was sick and had been up several times at night. He did take an extra short-acting diltiazem which seemed to kick in after about an hour.  Warren Nguyen returns today as he reports his heart has been out of rhythm for several days. He felt it was at her rhythm today however his interpretation of that is that he feels extra beats. I did have them come in the office today an EKG was performed which shows sinus rhythm and PACs.  Again, there is no evidence that he is having atrial fibrillation.   Current Outpatient Prescriptions  Medication Sig Dispense Refill  . aspirin 325 MG EC tablet Take 325 mg by mouth daily.      Marland Kitchen. diltiazem (CARDIZEM) 30 MG tablet Take 1 tablet (30 mg total) by mouth as needed.  30 tablet  1  . diltiazem (DILACOR XR) 180 MG 24 hr capsule Take 180 mg by mouth daily.      Marland Kitchen. levothyroxine (SYNTHROID, LEVOTHROID)  100 MCG tablet Take 100 mcg by mouth daily before breakfast.      . Naproxen Sodium (ALEVE) 220 MG CAPS Take 220 mg by mouth as needed.      . metoprolol tartrate (LOPRESSOR) 25 MG tablet Take 1 tablet (25 mg total) by mouth daily. Take 1/2 to 1 tablet daily as needed for palpitations.  30 tablet  5   No current facility-administered medications for this visit.    No Known Allergies  History   Social History  . Marital Status: Married    Spouse Name: N/A    Number of Children: N/A  . Years of Education: N/A   Occupational History  . Not on file.   Social History Main Topics  . Smoking status: Never Smoker   . Smokeless tobacco: Not on file  . Alcohol Use: No  . Drug Use: Not on file  . Sexual Activity: Not on file   Other Topics Concern  . Not on file   Social History Narrative  . No narrative on file     Review of Systems: deferred   Blood pressure 144/74, pulse 66, height 5\' 8"  (1.727 m).  Deferred exam today  EKG   Sinus rhythm with PACs at 66  ASSESSMENT AND PLAN:  Patient Active Problem List   Diagnosis Date Noted  . FH: CAD (coronary artery disease) 08/26/2013  . Hypothyroid, followed by PCP 08/26/2013  . PAF (paroxysmal atrial fibrillation) 10/12/2012  . Palpitations 10/12/2012  . HTN (hypertension) 10/12/2012  . Normal coronary arteries 10/12/2012   PLAN   Warren Nguyen is in sinus rhythm with PACs today. I do not see any evidence is having any atrial fibrillation. His monitor recently showed no episodes of A. fib. I would not recommend a pocket pill anti-arrhythmic strategy as there is no evidence is having A. fib. A safer alternative would be to use a beta blocker. He is fairly resistant to taking medication on a daily basis therefore recommend taking low-dose metoprolol tartrate 12.5 mg 1-2 tablets as needed daily for his palpitations. He is advised to contact our office is if he's having more frequent palpitations, and may need to increase his beta  blocker to a daily dose.  Followup will be in 6 months.  Chrystie Nose, MD, [facility] Attending Cardiologist [facility]  Reynard Christoffersen C 11/24/2013 4:09 PM

## 2013-11-24 NOTE — Telephone Encounter (Signed)
Patient states that he has not received a call back yet from yesterday.

## 2013-11-24 NOTE — Patient Instructions (Signed)
Start Metoprolol Tartrate 25mg  take 1/2 to 1 tablet daily as needed for palpitations.

## 2013-11-24 NOTE — Telephone Encounter (Signed)
Please have him come in for an EKG to see if he is a-fib.  We can prescribe a pocket pill for him.  Dr. HRexene Edison

## 2013-11-24 NOTE — Telephone Encounter (Signed)
Discussed with Dr. Rennis GoldenHilty and he would like the patient to come to the office for an EKG to see what rhythm his is in.  Called [place], [place], will come to the office about 3:30pm.  State his heart rate is normal but occas. Feels a skip or a slight pause. Will assess when he gets to the office.

## 2014-04-12 ENCOUNTER — Encounter: Payer: Self-pay | Admitting: Internal Medicine

## 2014-04-12 ENCOUNTER — Ambulatory Visit (INDEPENDENT_AMBULATORY_CARE_PROVIDER_SITE_OTHER): Payer: BC Managed Care – PPO | Admitting: Internal Medicine

## 2014-04-12 VITALS — BP 142/70 | HR 69 | Ht 68.0 in | Wt 194.2 lb

## 2014-04-12 DIAGNOSIS — I480 Paroxysmal atrial fibrillation: Secondary | ICD-10-CM

## 2014-04-12 DIAGNOSIS — I10 Essential (primary) hypertension: Secondary | ICD-10-CM

## 2014-04-12 DIAGNOSIS — IMO0001 Reserved for inherently not codable concepts without codable children: Secondary | ICD-10-CM

## 2014-04-12 DIAGNOSIS — Z0389 Encounter for observation for other suspected diseases and conditions ruled out: Secondary | ICD-10-CM

## 2014-04-12 DIAGNOSIS — R002 Palpitations: Secondary | ICD-10-CM

## 2014-04-12 MED ORDER — SILDENAFIL CITRATE 20 MG PO TABS: 40.0000 mg | ORAL_TABLET | Freq: Every day | ORAL | Status: DC | PRN

## 2014-04-12 NOTE — Progress Notes (Signed)
04/12/2014                Lauretta Grillobert Melecio   Jun 12, 1955  [id number]  Primary Physicia Jackson LatinoMessick, Mark, MD Primary Cardiologist: Dr Rennis GoldenHilty  HPI:  58 y/o previously followed by Dr Clarene DukeLittle with a history of PAF. He has a low CHADS score and is on ASA. I saw him in June after and episode and he is here today after another episode this weekend. The pt says he noted an irreg HR for about 6 hrs. He took an extra Diltiazem 60mg  and it broke spontaneously the next morning. He says "I Almost went to the ER".   Mr. Carmell AustriaGales had a one-month monitor. There were no episodes of atrial fibrillation or palpitations that were noted. He reported not being symptomatic during that time.  Recently he had more symptoms and was seen by Nada BoozerLaura Ingold, nurse practitioner, who recommended a stress test as there was some associated chest discomfort. In addition, it was thought that if he needed to undergo antiarrhythmic therapy, we need to exclude coronary artery disease. In fact, his stress test was negative. He said that he did have another episode of A. fib for about a half a day yesterday. He reported his dog was sick and had been up several times at night. He did take an extra short-acting diltiazem which seemed to kick in after about an hour.  Mr. Carmell AustriaGales says that he's been doing fairly well. His palpitations have improved. He has not needed to take the metoprolol in several months. He continues to want to use that as needed.   Current Outpatient Prescriptions  Medication Sig Dispense Refill  . aspirin 325 MG EC tablet Take 325 mg by mouth daily.    Marland Kitchen. diltiazem (CARDIZEM) 30 MG tablet Take 1 tablet (30 mg total) by mouth as needed. 30 tablet 1  . diltiazem (DILACOR XR) 180 MG 24 hr capsule Take 180 mg by mouth daily.    Marland Kitchen. levothyroxine (SYNTHROID, LEVOTHROID) 100 MCG tablet Take 100 mcg by mouth daily before breakfast.    . metoprolol tartrate (LOPRESSOR) 25 MG tablet Take 1 tablet (25 mg total) by mouth daily. Take 1/2 to 1 tablet  daily as needed for palpitations. 30 tablet 5  . Naproxen Sodium (ALEVE) 220 MG CAPS Take 220 mg by mouth as needed.    . sildenafil (REVATIO) 20 MG tablet Take 2-3 tablets (40-60 mg total) by mouth daily as needed. 50 tablet 0   No current facility-administered medications for this visit.    No Known Allergies  History   Social History  . Marital Status: Married    Spouse Name: N/A    Number of Children: N/A  . Years of Education: N/A   Occupational History  . Not on file.   Social History Main Topics  . Smoking status: Never Smoker   . Smokeless tobacco: Not on file  . Alcohol Use: No  . Drug Use: Not on file  . Sexual Activity: Not on file   Other Topics Concern  . Not on file   Social History Narrative     Review of Systems: Occasional palpitations, otherwise 12 point review systems is negative.   Blood pressure 142/70, pulse 69, height 5\' 8"  (1.727 m), weight 194 lb 3.2 oz (88.089 kg).  GEN: Awake, NAD HEENT: PERRLA, EOMI Lungs: Clear bilaterally Heart: RRR, NL S1/S2, no M/R/G's Abdomen: Soft, nontender, plus bowel sounds 70s: No edema Neurologic: Grossly nonfocal Psych: Normal mood, affect  EKG  Sinus rhythm at 69  ASSESSMENT AND PLAN:   Patient Active Problem List   Diagnosis Date Noted  . FH: CAD (coronary artery disease) 08/26/2013  . Hypothyroid, followed by PCP 08/26/2013  . PAF (paroxysmal atrial fibrillation) 10/12/2012  . Palpitations 10/12/2012  . HTN (hypertension) 10/12/2012  . Normal coronary arteries 10/12/2012   PLAN   Mr. Carmell AustriaGales Is doing better with less frequent palpitations. He is interested in doing exercise and there is no limitation for doing that. In fact that may help him with his palpitations. He can continue to use the beta blocker as needed for breakthrough. We did review his medications and I think he will do just fine being on low-dose aspirin for possible stroke prevention versus taking full dose aspirin, with slightly  lower bleeding risk.  Follow-up annually.  Chrystie NoseKenneth C. Johnnay Pleitez, MD, [facility] Attending Cardiologist [facility]  Mcarthur Ivins C 04/12/2014 4:15 PM

## 2014-04-12 NOTE — Patient Instructions (Signed)
Your physician wants you to follow-up in: 1 year with Dr. Hilty. You will receive a reminder letter in the mail two months in advance. If you don't receive a letter, please call our office to schedule the follow-up appointment.  

## 2015-03-28 ENCOUNTER — Telehealth: Payer: Self-pay | Admitting: Internal Medicine

## 2015-03-28 MED ORDER — DILTIAZEM HCL 30 MG PO TABS: 30.0000 mg | ORAL_TABLET | ORAL | Status: AC | PRN

## 2015-03-28 MED ORDER — METOPROLOL TARTRATE 25 MG PO TABS: 25.0000 mg | ORAL_TABLET | Freq: Every day | ORAL | Status: DC

## 2015-03-28 NOTE — Telephone Encounter (Signed)
Pt informs me he has these episodes from time to time but hasn't had in >1 yr, took PRN cardizem last night and then PRN metoprolol this morning for symptom relief.  He experienced the A Fib episode for ~6 hrs. Now reports resolved spontaneously & having no symptoms. He has been given instructions to use the 30mg  cardizem for this purpose in addition to daily dose of 180mg . Likewise w/ 25mg  metoprolol.  Affirmed instructions for medication use. Informed I would send refill for [facility] meds [facility] [place] - he thinks the pills he has are out of date or soon to be expired.  Set pt up for yearly OV in January & confirmed appt on phone.

## 2015-03-28 NOTE — Telephone Encounter (Signed)
[name] is calling because he is experiencing some Afib symptoms this morning , Please call   Thanks

## 2015-04-24 ENCOUNTER — Telehealth: Payer: Self-pay | Admitting: Internal Medicine

## 2015-04-24 NOTE — Telephone Encounter (Signed)
°  New Prob   Pt has a question regarding one of his medications (Metoprolol). Pt states "im experiencing some extra beats today and wondering if its okay to take an extra dose". Please call.

## 2015-04-24 NOTE — Telephone Encounter (Signed)
Returned call to patient. Pt has only taken 12.5mg  metoprolol today - usually he only needs this or 25mg  a day or none at all.  He has Rx for metoprolol 25mg  daily and 12.5mg -25mg  PRN for palps - advised OK to take at recommended dose - could take additional 1/2-whole tablet if needed.  Pt also states he had episode of viral illness, vomiting yesterday. Vomiting resolved, he is drinking water, eating small portions of food. Advised dehydration could contribute to faster HR, recommended gatorade/sports drinks in addition to water for fluid replenishment. Call if further concerns/needs. Pt voiced understanding of recommendations & thanks for the call.

## 2015-05-17 ENCOUNTER — Ambulatory Visit: Payer: Self-pay | Admitting: Internal Medicine

## 2015-06-08 ENCOUNTER — Ambulatory Visit (INDEPENDENT_AMBULATORY_CARE_PROVIDER_SITE_OTHER): Payer: BC Managed Care – PPO | Admitting: Internal Medicine

## 2015-06-08 ENCOUNTER — Encounter: Payer: Self-pay | Admitting: Internal Medicine

## 2015-06-08 VITALS — BP 134/86 | HR 64 | Ht 68.0 in | Wt 195.3 lb

## 2015-06-08 DIAGNOSIS — Z0389 Encounter for observation for other suspected diseases and conditions ruled out: Secondary | ICD-10-CM

## 2015-06-08 DIAGNOSIS — I480 Paroxysmal atrial fibrillation: Secondary | ICD-10-CM

## 2015-06-08 DIAGNOSIS — I10 Essential (primary) hypertension: Secondary | ICD-10-CM | POA: Diagnosis not present

## 2015-06-08 DIAGNOSIS — IMO0001 Reserved for inherently not codable concepts without codable children: Secondary | ICD-10-CM

## 2015-06-08 DIAGNOSIS — R002 Palpitations: Secondary | ICD-10-CM

## 2015-06-08 NOTE — Patient Instructions (Signed)
Dr Hilty recommends that you schedule a follow-up appointment in 1 year. You will receive a reminder letter in the mail two months in advance. If you don't receive a letter, please call our office to schedule the follow-up appointment.  If you need a refill on your cardiac medications before your next appointment, please call your pharmacy. 

## 2015-06-11 NOTE — Progress Notes (Signed)
06/11/2015                [name]   [birth year]  [id number]  Primary Physicia Jackson Latino, MD Primary Cardiologist: Dr Rennis Golden  HPI:  60 y/o previously followed by Dr Clarene Duke with a history of PAF. He has a low CHADS score and is on ASA. I saw him in June after and episode and he is here today after another episode this weekend. The pt says he noted an irreg HR for about 6 hrs. He took an extra Diltiazem  and it broke spontaneously the next morning. He says "I Almost went to the ER".   Mr. Pruiett had a one-month monitor. There were no episodes of atrial fibrillation or palpitations that were noted. He reported not being symptomatic during that time.  Recently he had more symptoms and was seen by Nada Boozer, nurse practitioner, who recommended a stress test as there was some associated chest discomfort. In addition, it was thought that if he needed to undergo antiarrhythmic therapy, we need to exclude coronary artery disease. In fact, his stress test was negative. He said that he did have another episode of A. fib for about a half a day yesterday. He reported his dog was sick and had been up several times at night. He did take an extra short-acting diltiazem which seemed to kick in after about an hour.  Mr. Alberto says that he's been doing fairly well. His palpitations have improved. He has not needed to take the metoprolol in several months. He continues to want to use that as needed.   Current Outpatient Prescriptions  Medication Sig Dispense Refill  . aspirin 81 MG tablet Take 81 mg by mouth daily.    Marland Kitchen diltiazem (CARDIZEM) 30 MG tablet Take 1 tablet (30 mg total) by mouth as needed. 30 tablet 1  . diltiazem (DILACOR XR) 180 MG 24 hr capsule Take 180 mg by mouth daily.    Marland Kitchen levothyroxine (SYNTHROID, LEVOTHROID) 100 MCG tablet Take 100 mcg by mouth daily before breakfast.    . metoprolol tartrate (LOPRESSOR) 25 MG tablet Take 1 tablet (25 mg total) by mouth daily. Take 1/2 to 1 tablet daily  as needed for palpitations. 30 tablet 1  . Naproxen Sodium (ALEVE) 220 MG CAPS Take 220 mg by mouth as needed.    . sildenafil (REVATIO) 20 MG tablet Take 2-3 tablets (40-60 mg total) by mouth daily as needed. 50 tablet 0   No current facility-administered medications for this visit.    No Known Allergies  Social History   Social History  . Marital Status: Married    Spouse Name: N/A  . Number of Children: N/A  . Years of Education: N/A   Occupational History  . Not on file.   Social History Main Topics  . Smoking status: Never Smoker   . Smokeless tobacco: Not on file  . Alcohol Use: No  . Drug Use: Not on file  . Sexual Activity: Not on file   Other Topics Concern  . Not on file   Social History Narrative     Review of Systems: Occasional palpitations, otherwise 12 point review systems is negative.   Blood pressure 134/86, pulse 64, height  (1.727 m), weight 195 lb 4.8 oz (88.587 kg).  GEN: Awake, NAD HEENT: PERRLA, EOMI Lungs: Clear bilaterally Heart: RRR, NL S1/S2, no M/R/G's Abdomen: Soft, nontender, plus bowel sounds 70s: No edema Neurologic: Grossly nonfocal Psych: Normal mood, affect  EKG  Sinus rhythm at 64  ASSESSMENT AND PLAN:   Patient Active Problem List   Diagnosis Date Noted  . FH: CAD (coronary artery disease) 08/26/2013  . Hypothyroid, followed by PCP 08/26/2013  . PAF (paroxysmal atrial fibrillation) (HCC) 10/12/2012  . Palpitations 10/12/2012  . HTN (hypertension) 10/12/2012  . Normal coronary arteries 10/12/2012   PLAN   Mr. Stump has very infrequent palpitaitons, for which he uses either metoprolol or short-acting diltiazem. He does not believe he has hypertension. Think it is more situational and takes medication if bp goes up. Seems to be symptomatic with that as well. CHADSVASC score is 0-1. He prefers to be on low dose aspirin for the time-being. He understands, as he gets older, his CHADSVASC Score will likely  rise.  Follow-up annually.  Chrystie Nose, MD, [facility] Attending Cardiologist [facility]  Lisette Abu [facility] 06/11/2015 11:56 PM

## 2016-05-02 ENCOUNTER — Telehealth: Payer: Self-pay | Admitting: Internal Medicine

## 2016-05-02 NOTE — Telephone Encounter (Signed)
Spoke to this gentleman.  Pt of Dr. Rennis GoldenHilty who has history of A fib controlled on daily cardizem. He is aware of his episodes, and has breakthrough A Fib very occasionally for which he uses PRN 30mg  cardizem.  Sees [facility] for primary care. Pt notes at appt w PCP in Sept, his 24hr dose of cardizem scaled down from 180mg  to 120mg  This was due to him being symptomatic w low resting HR and BP.  Since dose change, his only reported breakthrough AF episode was last night, as noted. He attributes episode to holiday related stress, states his only concern was that it lasted for several hours because he did not have his 30mg  PRN cardizem on-hand - (was away from home). He took an extra tablet of metoprolol instead, which did finally alleviate his symptoms.  Pt expresses no concern and notes confidence in taking his medications, including PRNs. Just wanted to "touch base" with us and notes he has his upcoming annual OV w Dr. Rennis GoldenHilty in February. I've acknowledged patient's med regimen, updated list. Encouraged him to make sure he has the PRN cardizem on hand as it seems to him this is a little more effective for his AF episodes. He's aware I will route this message to Dr. Rennis GoldenHilty and f/u w him if anything else advised.

## 2016-05-02 NOTE — Telephone Encounter (Signed)
[name] is calling because he had AFIB last night and states that he is "back to normal today." He is requesting that someone give him a call. You can reach him at this home [phone]#([phone]) or his cell # [phone].) Thanks.

## 2016-05-09 NOTE — Telephone Encounter (Signed)
I agree with your recommendations.  Dr HRexene Edison

## 2016-06-10 ENCOUNTER — Ambulatory Visit: Payer: BC Managed Care – PPO | Admitting: Internal Medicine

## 2016-06-18 ENCOUNTER — Telehealth: Payer: Self-pay | Admitting: Internal Medicine

## 2016-06-18 NOTE — Telephone Encounter (Signed)
Spoke to patient. Notes he's had palpitations in past, these resolve generally with a 1/2 tab of metoprolol tartrate. He's "had the same bottle for 2 years" and rarely needs to use. Notes episodes are sometimes very occasional, but he will get 2-3 days in a row of his palpitations, which will last ~2 hrs. He doesn't take metoprolol daily. He's taken 1/2 tab this morning to relieve onset of palpitations.  Denies SOB, chest pain, nausea, fatigue, etc. He does report recent URI for which he's taking amoxicillin, a nasal decongestant, and using OTC meds for symptomatic relief. Informed him that often these meds can excite heart rate and raise BP, so to avoid decongestants and products w -D or -DM on label. Pt voiced understanding.  For now, advised extra 1/2 tab of metoprolol (instructions for his med state to take 1/2 to whole tab PRN for palpitations). Informed him to track for improvement, call if continued concerns or new symptoms.   He follows up again w Dr. Rennis GoldenHilty on March 2nd.   Routed to provider for further advice.

## 2016-06-18 NOTE — Telephone Encounter (Signed)
Pt says he have been having some extra heartbeats since Saturday. Pt says the Metoprolol usually make it go away,this time it helps but still having them.

## 2016-06-20 NOTE — Telephone Encounter (Signed)
Pt advised that Dr. Rennis GoldenHilty voiced agreement w prior suggestions, continue w this regimen, call if new problems, o/w follow up as scheduled. He voiced thanks for call and understanding of instructions.

## 2016-06-20 NOTE — Telephone Encounter (Signed)
I agree with those recommendations. Thanks.  Dr. HRexene Edison

## 2016-07-05 ENCOUNTER — Encounter: Payer: Self-pay | Admitting: Internal Medicine

## 2016-07-05 ENCOUNTER — Ambulatory Visit (INDEPENDENT_AMBULATORY_CARE_PROVIDER_SITE_OTHER): Payer: BC Managed Care – PPO | Admitting: Internal Medicine

## 2016-07-05 VITALS — BP 146/82 | HR 64 | Ht 68.0 in | Wt 191.6 lb

## 2016-07-05 DIAGNOSIS — I480 Paroxysmal atrial fibrillation: Secondary | ICD-10-CM | POA: Diagnosis not present

## 2016-07-05 DIAGNOSIS — N529 Male erectile dysfunction, unspecified: Secondary | ICD-10-CM | POA: Insufficient documentation

## 2016-07-05 DIAGNOSIS — R002 Palpitations: Secondary | ICD-10-CM | POA: Diagnosis not present

## 2016-07-05 DIAGNOSIS — I10 Essential (primary) hypertension: Secondary | ICD-10-CM

## 2016-07-05 MED ORDER — SILDENAFIL CITRATE 20 MG PO TABS: 40.0000 mg | ORAL_TABLET | Freq: Every day | ORAL | 0 refills | Status: AC | PRN

## 2016-07-05 MED ORDER — METOPROLOL TARTRATE 25 MG PO TABS: 12.5000 mg | ORAL_TABLET | Freq: Every day | ORAL | 5 refills | Status: AC | PRN

## 2016-07-05 NOTE — Progress Notes (Signed)
07/05/2016                Warren Nguyen   10/31/1955  [id number]  Primary Physicia Jackson LatinoMessick, Mark, MD Primary Cardiologist: Dr Rennis GoldenHilty  HPI:  61 y/o previously followed by Dr Clarene DukeLittle with a history of PAF. He has a low CHADS score and is on ASA. I saw him in June after and episode and he is here today after another episode this weekend. The pt says he noted an irreg HR for about 6 hrs. He took an extra Diltiazem 60mg  and it broke spontaneously the next morning. He says "I Almost went to the ER".   Warren Nguyen had a one-month monitor. There were no episodes of atrial fibrillation or palpitations that were noted. He reported not being symptomatic during that time.  Recently he had more symptoms and was seen by Warren Nguyen, nurse practitioner, who recommended a stress test as there was some associated chest discomfort. In addition, it was thought that if he needed to undergo antiarrhythmic therapy, we need to exclude coronary artery disease. In fact, his stress test was negative. He said that he did have another episode of A. fib for about a half a day yesterday. He reported his dog was sick and had been up several times at night. He did take an extra short-acting diltiazem which seemed to kick in after about an hour.  Warren Nguyen says that he's been doing fairly well. His palpitations have improved. He has not needed to take the metoprolol in several months. He continues to want to use that as needed.  07/05/2016  Warren Nguyen returns today for follow-up. Overall seems to be doing well. He does have some infrequent palpitations. He seems to take either metoprolol or diltiazem for that. Dr. Delories HeinzWeathers had any recurrent A. fib and today shows sinus rhythm on his EKG. CHADSVASC score 0-1 but he does not want any additional anticoagulation beyond aspirin. He did successfully retire recently and reports his stress is reduced and therefore palpitations may improve.    Current Outpatient Prescriptions  Medication Sig  Dispense Refill  . aspirin 81 MG tablet Take 81 mg by mouth daily.    . Cholecalciferol (VITAMIN D PO) Take 1 tablet by mouth daily.    Marland Kitchen. diltiazem (CARDIZEM) 30 MG tablet Take 1 tablet (30 mg total) by mouth as needed. 30 tablet 1  . diltiazem (DILACOR XR) 120 MG 24 hr capsule Take 120 mg by mouth daily.     Marland Kitchen. levothyroxine (SYNTHROID, LEVOTHROID) 100 MCG tablet Take 100 mcg by mouth daily before breakfast.    . metoprolol tartrate (LOPRESSOR) 25 MG tablet Take 0.5 tablets (12.5 mg total) by mouth daily as needed. 30 tablet 5  . Naproxen Sodium (ALEVE) 220 MG CAPS Take 220 mg by mouth as needed.    . sildenafil (REVATIO) 20 MG tablet Take 2-3 tablets (40-60 mg total) by mouth daily as needed. 50 tablet 0   No current facility-administered medications for this visit.     No Known Allergies  Social History   Social History  . Marital status: Married    Spouse name: N/A  . Number of children: N/A  . Years of education: N/A   Occupational History  . Not on file.   Social History Main Topics  . Smoking status: Never Smoker  . Smokeless tobacco: Never Used  . Alcohol use No  . Drug use: Unknown  . Sexual activity: Not on file   Other Topics Concern  .  Not on file   Social History Narrative  . No narrative on file     Review of Systems: Occasional palpitations, otherwise 12 point review systems is negative.   Blood pressure (!) 146/82, pulse 64, height 5\' 8"  (1.727 m), weight 191 lb 9.6 oz (86.9 kg).  GEN: Awake, NAD HEENT: PERRLA, EOMI Lungs: Clear bilaterally Heart: RRR, NL S1/S2, no M/R/G's Abdomen: Soft, nontender, plus bowel sounds 70s: No edema Neurologic: Grossly nonfocal Psych: Normal mood, affect  EKG   NSR at 64  ASSESSMENT AND PLAN:   Patient Active Problem List   Diagnosis Date Noted  . FH: CAD (coronary artery disease) 08/26/2013  . Hypothyroid, followed by PCP 08/26/2013  . PAF (paroxysmal atrial fibrillation) (HCC) 10/12/2012  . Palpitations  10/12/2012  . HTN (hypertension) 10/12/2012  . Normal coronary arteries 10/12/2012   PLAN   [name] Seems to be doing very well with infrequent palpitations. He recently retired and has less stress which might be helping the situation. Blood pressure was up a little bit today but he said is much better controlled at home. He is interested in a refill of his sildenafil which is worked well for him. We'll continue his current dose of diltiazem and he should use the metoprolol tartrate as needed.  Follow-up annually or sooner as necessary.  Chrystie Nose, MD, [facility] Attending Cardiologist [facility]  Lisette Abu [facility] 07/05/2016 1:44 PM

## 2016-07-05 NOTE — Patient Instructions (Signed)
Your physician wants you to follow-up in: ONE YEAR with Dr. Hilty. You will receive a reminder letter in the mail two months in advance. If you don't receive a letter, please call our office to schedule the follow-up appointment.  

## 2016-12-03 ENCOUNTER — Telehealth: Payer: Self-pay | Admitting: Internal Medicine

## 2016-12-03 NOTE — Telephone Encounter (Signed)
L/m v/m

## 2016-12-03 NOTE — Telephone Encounter (Signed)
Warren Nguyen is calling because he states that when he is doing his morning walk ,he feels some chest tightness. It has happen twiec . Please call

## 2016-12-03 NOTE — Telephone Encounter (Signed)
Spoke with pt he states that he went walking last Monday he resumed his walking after he injured his knee. He states that this is the only reason that he stopped walking, he states that no other issues for stopping BP was running fine also.Pt states that he developed several episodes of chest tightness, no chest pain or AFIB sx. He state that when he got home he burped a lot and just thought that was what it was until he had the same episodes today.  This chest tightness happens a few times but will resolve quickly just by stopping and resting for a few minutes, and will start walking again this happens a few times but always resolves. This happened again today when walking, this is just the 2nd time that he has walked. BP running 137/80-150/82 , BP today 152/82 HR 66.  Pt states that he is taking all medications as ordered. Please advise

## 2016-12-03 NOTE — Telephone Encounter (Signed)
He seems to be describing exertional angina. Should be seen in clinic this week. He should go to ED if he has pain that does not stop at rest and lasts for over 20-30 minutes. Please confirm that he is taking his ASA daily. MCr

## 2016-12-03 NOTE — Telephone Encounter (Signed)
Pt notified of Dr C'sdirection (pt states that he is taking ASA 81mg  daily)he will make note of if/when this happens and will go to the ER as noted if needed verbalizes understanding. Appt with APP scheduled 12-23-16 (1st available)

## 2016-12-23 ENCOUNTER — Ambulatory Visit: Payer: BC Managed Care – PPO | Admitting: Physician Assistant
